# Patient Record
Sex: Male | Born: 1990 | Race: Black or African American | Hispanic: No | Marital: Single | Smoking: Current every day smoker
Health system: Southern US, Community
[De-identification: ages and names within clinical notes are randomized; demographics above are authoritative.]

---

## 2014-09-24 ENCOUNTER — Emergency Department (HOSPITAL_BASED_OUTPATIENT_CLINIC_OR_DEPARTMENT_OTHER)
Admission: EM | Admit: 2014-09-24 | Discharge: 2014-09-24 | Disposition: A | Discharge status: Home / Self Care | Payer: Self-pay | Attending: Emergency Medicine | Admitting: Emergency Medicine

## 2014-09-24 ENCOUNTER — Encounter (HOSPITAL_BASED_OUTPATIENT_CLINIC_OR_DEPARTMENT_OTHER): Payer: Self-pay

## 2014-09-24 DIAGNOSIS — K088 Other specified disorders of teeth and supporting structures: Secondary | ICD-10-CM | POA: Insufficient documentation

## 2014-09-24 DIAGNOSIS — K0263 Dental caries on smooth surface penetrating into pulp: Secondary | ICD-10-CM | POA: Insufficient documentation

## 2014-09-24 DIAGNOSIS — K029 Dental caries, unspecified: Secondary | ICD-10-CM

## 2014-09-24 DIAGNOSIS — K0889 Other specified disorders of teeth and supporting structures: Secondary | ICD-10-CM

## 2014-09-24 MED ORDER — ONDANSETRON 4 MG PO TBDP
4.0000 mg | ORAL_TABLET | Freq: Once | ORAL | Status: AC
  Administered 2014-09-24: 4 mg via ORAL
  Filled 2014-09-24: qty 1

## 2014-09-24 MED ORDER — PENICILLIN V POTASSIUM 500 MG PO TABS: 500.0000 mg | ORAL_TABLET | Freq: Four times a day (QID) | ORAL | Status: DC

## 2014-09-24 MED ORDER — OXYCODONE-ACETAMINOPHEN 5-325 MG PO TABS
2.0000 | ORAL_TABLET | Freq: Once | ORAL | Status: AC
  Administered 2014-09-24: 2 via ORAL
  Filled 2014-09-24: qty 2

## 2014-09-24 MED ORDER — OXYCODONE-ACETAMINOPHEN 5-325 MG PO TABS: 1.0000 | ORAL_TABLET | ORAL | Status: DC | PRN

## 2014-09-24 NOTE — ED Notes (Signed)
Left top toothache x 1 week

## 2014-09-24 NOTE — Discharge Instructions (Signed)
You have been diagnosed with Dental pain. Please call the follow up dentist first thing in the morning on Monday for a follow up appointment. Keep your discharge paperwork from today's visit to bring to the dentist office. You may also use the resource guide listed below to help you find a dentist if you do not already have one to followup with. It is very important that you get evaluated by a dentist as soon as possible.  Use your pain medication as prescribed and do not operate heavy machinery while on pain medication. Note that your pain medication contains acetaminophen (Tylenol) & its is not reccommended that you use additional acetaminophen (Tylenol) while taking this medication. Take your full course of antibiotics. Read the instructions below. ° °Eat a soft or liquid diet and rinse your mouth out after meals with warm water. You should see a dentist or return here at once if you have increased swelling, increased pain or uncontrolled bleeding from the site of your injury. ° ° °SEEK MEDICAL CARE IF:  °· You have increased pain not controlled with medicines.  °· You have swelling around your tooth, in your face or neck.  °· You have bleeding which starts, continues, or gets worse.  °· You have a fever >101 °· If you are unable to open your mouth °Soft Diet  °The soft diet may be recommended after you were put on a full liquid diet. A normal diet may follow. The soft diet can also be used after surgery if you are too ill to keep down a normal diet. The soft diet may also be needed if you have a hard time chewing foods.  °DESCRIPTION  °Tender foods are used. Foods do not need to be ground or pureed. Most raw fruits and vegetables and coarse breads and cereals should be avoided. Fried foods and highly seasoned foods may cause discomfort.  °NUTRITIONAL ADEQUACY  °A healthy diet is possible if foods from each of the basic food groups are eaten daily.  °SOFT DIET FOOD LISTS  °Milk/Dairy  °Allowed: Milk and milk  drinks, milk shakes, cream cheese, cottage cheese, mild cheeses.  °Avoid: Sharp or highly seasoned cheese. °Meat/Meat Substitutes  °Allowed: Broiled, roasted, baked, or stewed tender lean beef, mutton, lamb, veal, chicken, turkey, liver, ham, crisp bacon, white fish, tuna, salmon. Eggs, smooth peanut butter.  °Avoid: All fried meats, fish, or fowl. Rich gravies and sauces. Lunch meats, sausages, hot dogs. Meats with gristle, chunky peanut butter. °Breads/Grains  °Allowed: Rice, noodles, spaghetti, macaroni. Dry or cooked refined cereals, such as farina, cream of wheat, oatmeal, grits, whole-wheat cereals. Plain or toasted white or wheat blend or whole-grain breads, soda crackers or saltines, flour tortillas.  °Avoid: Wild rice, coarse cereals, such as bran. Seed in or on breads and crackers. Bread or bread products with nuts or seeds. °Fruits/Vegetables  °Allowed: Fruit and vegetable juices, well-cooked or canned fruits and vegetables, any dried fruit. One citrus fruit daily, 1 vitamin A source daily. Well-ripened, easy to chew fruits, sweet potatoes. Baked, boiled, mashed, creamed, scalloped, or au gratin potatoes. Broths or creamed soups made with allowed vegetables, strained tomatoes.  °Avoid: All gas-forming vegetables (corn, radishes, Brussels sprouts, onions, broccoli, cabbage, parsnips, turnips, chili peppers, pinto beans, split peas, dried beans). Fruits containing seeds and skin. Potato chips and corn chips. All others that are not made with allowed vegetables. Highly seasoned soups. °Desserts/Sweets  °Allowed: Simple desserts, such as custard, junkets, gelatin desserts, plain ice cream and sherbets, simple cakes   and cookies, allowed fruits, sugar, syrup, jelly, honey, plain hard candy, and molasses.  °Avoid: Rich pastries, any dessert containing dates, nuts, raisins, or coconut. Fried pastries, such as doughnuts. Chocolate. °Beverages  °Allowed: Fruit and vegetable juices. Caffeine-free carbonated drinks,  coffee, and tea.  °Avoid: Caffeinated beverages: coffee, tea, soda or pop. °Miscellaneous  °Allowed: Butter, cream, margarine, mayonnaise, oil. Cream sauces, salt, and mild spices.  °Avoid: Highly spiced salad dressings. Highly seasoned foods, hot sauce, mustard, horseradish, and pepper. °SAMPLE MENU  °Breakfast  °Orange juice.  °Oatmeal.  °Soft cooked egg.  °Toast and margarine.  °2% milk.  °Coffee. °Lunch  °Meatloaf.  °Mashed potato.  °Green beans.  °Lemon pudding.  °Bread and margarine.  °Coffee. °Dinner  °Consommé or apricot nectar.  °Chicken breast.  °Rice, peas, and carrots.  °Applesauce.  °Bread and margarine.  °2% milk. °To cut the amount of fat in your diet, omit margarine and use 1% or skim milk.  °NUTRIENT ANALYSIS  °Calories........................1953 Kcal.  °Protein.........................102 gm.  °Carbohydrate...............247 gm.  °Fat................................65 gm.  °Cholesterol...................449 mg.  °Dietary fiber.................19 gm.  °Vitamin A.....................2944 RE.  °Vitamin C.....................79 mg.  °Niacin..........................25 mg.  °Riboflavin....................2.0 mg.  °Thiamin.......................1.5 mg.  °Folate..........................249 mcg.  °Calcium.......................1030 mg.  °Phosphorus.................1782 mg.  °Zinc..............................12 mg.  °Iron..............................13 mg.  °Sodium.........................299 mg.  °Potassium....................3046 mg. °Document Released: 11/22/2007 Document Revised: 11/07/2011 Document Reviewed: 11/22/2007  °ExitCare® Patient Information ©2014 [employer].  ° °RESOURCE GUIDE ° ° °Dental Problems ° °Dr. Janna Civilis °$200 dollar visit °[address] °[place],  [address]  °[phone] °  ° °Patients with Medicaid: °[facility]                     [facility] °[address]                                           [address] °Phone:   [phone]                                                  Phone:  [phone] ° °If unable to pay or uninsured, contact:  [facility] or [facility] to become qualified for the adult dental clinic. ° °Chronic Pain Problems °Contact [facility]  [phone] °Patients need to be referred by their primary care doctor. ° °Insufficient Money for Medicine °Contact [employer]:  call "211" or [facility] 271-5999. ° °No Primary Care Doctor °Call [facility]  832-8000 °Other agencies that provide inexpensive medical care °   [facility]  [phone] °   [facility]  832-7272 °   [facility]  [phone] °   [facility]  832-4777 °   [facility]  373-0678 °   [facility]  [phone] ° °Psychological Services °[facility]  [phone] °[facility]  [phone] °[facility]   [phone] (emergency services 641-4993) ° °Substance Abuse Resources °[facility]  336-882-2125 °[facility] 336-784-9470 °[facility] 336-285-9073 °[facility] 336-845-3988 °Residential & Outpatient Substance Abuse Program  800-659-3381 ° °Abuse/Neglect °[place] [facility] Hotline (336) 641-3795 °[place] [facility] Abuse Hotline 800-378-5315 (After Hours) ° °Emergency Shelter °[place]   [employer] (518)010-3732  [facility] [place] [facility] (684)687-3481 [facility] 240-293-0544  MRSA Hotline #:   401-027-8057    [facility]  [facility]     [employer]                          [facility] [address] [place]                       [address]      [address]  [place] Reveal Phone:  [phone]                                   Phone:  (713)[phone]                 Phone:   (479)[phone]  [facility] Phone:  (787)649-4950  [facility] Child Abuse Hotline 225-367-2118 (905)133-2475 (After Hours)        Dental Caries Dental caries (also called tooth decay) is the most common oral disease. It can occur at any age but is more common in children and young adults.  HOW DENTAL CARIES DEVELOPS  The process of decay begins when bacteria and foods (particularly sugars and starches) combine in your mouth to produce plaque. Plaque is a substance that sticks to the hard, outer surface of a tooth (enamel). The bacteria in plaque produce acids that attack enamel. These acids may also attack the root surface of a tooth (cementum) if it is exposed. Repeated attacks dissolve these surfaces and create holes in the tooth (cavities). If left untreated, the acids destroy the other layers of the tooth.  RISK FACTORS  Frequent sipping of sugary beverages.   Frequent snacking on sugary and starchy foods, especially those that easily get stuck in the teeth.   Poor oral hygiene.   Dry mouth.   Substance abuse such as methamphetamine abuse.   Broken or poor-fitting dental restorations.   Eating disorders.   Gastroesophageal reflux disease (GERD).   Certain radiation treatments to the head and neck. SYMPTOMS In the early stages of dental caries, symptoms are seldom present. Sometimes white, chalky areas may be seen on the enamel or other tooth layers. In later stages, symptoms may include:  Pits and holes on the enamel.  Toothache after sweet, hot, or cold foods or drinks are consumed.  Pain around the tooth.  Swelling around the tooth. DIAGNOSIS  Most of the time, dental caries is detected during a regular dental checkup. A diagnosis is made after a thorough medical and dental history is taken and the surfaces of your teeth are checked for signs of dental caries. Sometimes special instruments, such as lasers, are used to check for dental  caries. Dental X-ray exams may be taken so that areas not visible to the eye (  such as between the contact areas of the teeth) can be checked for cavities.  TREATMENT  If dental caries is in its early stages, it may be reversed with a fluoride treatment or an application of a remineralizing agent at the dental office. Thorough brushing and flossing at home is needed to aid these treatments. If it is in its later stages, treatment depends on the location and extent of tooth destruction:   If a small area of the tooth has been destroyed, the destroyed area will be removed and cavities will be filled with a material such as gold, silver amalgam, or composite resin.   If a large area of the tooth has been destroyed, the destroyed area will be removed and a cap (crown) will be fitted over the remaining tooth structure.   If the center part of the tooth (pulp) is affected, a procedure called a root canal will be needed before a filling or crown can be placed.   If most of the tooth has been destroyed, the tooth may need to be pulled (extracted). HOME CARE INSTRUCTIONS You can prevent, stop, or reverse dental caries at home by practicing good oral hygiene. Good oral hygiene includes:  Thoroughly cleaning your teeth at least twice a day with a toothbrush and dental floss.   Using a fluoride toothpaste. A fluoride mouth rinse may also be used if recommended by your dentist or health care provider.   Restricting the amount of sugary and starchy foods and sugary liquids you consume.   Avoiding frequent snacking on these foods and sipping of these liquids.   Keeping regular visits with a dentist for checkups and cleanings. PREVENTION   Practice good oral hygiene.  Consider a dental sealant. A dental sealant is a coating material that is applied by your dentist to the pits and grooves of teeth. The sealant prevents food from being trapped in them. It may protect the teeth for several  years.  Ask about fluoride supplements if you live in a community without fluorinated water or with water that has a low fluoride content. Use fluoride supplements as directed by your dentist or health care provider.  Allow fluoride varnish applications to teeth if directed by your dentist or health care provider. Document Released: 05/07/2002 Document Revised: 12/30/2013 Document Reviewed: 08/17/2012 [facility] Patient Information 2015 [place], [place]. This information is not intended to replace advice given to you by your health care provider. Make sure you discuss any questions you have with your health care provider.  Dental Pain A tooth ache may be caused by cavities (tooth decay). Cavities expose the nerve of the tooth to air and hot or cold temperatures. It may come from an infection or abscess (also called a boil or furuncle) around your tooth. It is also often caused by dental caries (tooth decay). This causes the pain you are having. DIAGNOSIS  Your caregiver can diagnose this problem by exam. TREATMENT   If caused by an infection, it may be treated with medications which kill germs (antibiotics) and pain medications as prescribed by your caregiver. Take medications as directed.  Only take over-the-counter or prescription medicines for pain, discomfort, or fever as directed by your caregiver.  Whether the tooth ache today is caused by infection or dental disease, you should see your dentist as soon as possible for further care. SEEK MEDICAL CARE IF: The exam and treatment you received today has been provided on an emergency basis only. This is not a substitute for complete medical  or dental care. If your problem worsens or new problems (symptoms) appear, and you are unable to meet with your dentist, call or return to this location. SEEK IMMEDIATE MEDICAL CARE IF:   You have a fever.  You develop redness and swelling of your face, jaw, or neck.  You are unable to open your  mouth.  You have severe pain uncontrolled by pain medicine. MAKE SURE YOU:   Understand these instructions.  Will watch your condition.  Will get help right away if you are not doing well or get worse. Document Released: 08/15/2005 Document Revised: 11/07/2011 Document Reviewed: 04/02/2008 [facility] Patient Information 2015 [place], [place]. This information is not intended to replace advice given to you by your health care provider. Make sure you discuss any questions you have with your health care provider.

## 2014-09-24 NOTE — ED Provider Notes (Signed)
CSN: [id number]     Arrival date & time 09/24/14  1736 History   First MD Initiated Contact with Patient 09/24/14 1853     Chief Complaint  Patient presents with  . Dental Pain     (Consider location/radiation/quality/duration/timing/severity/associated sxs/prior Treatment) Patient is a 24 y.o. male presenting with tooth pain. The history is provided by the patient. No language interpreter was used.  Dental Pain Location:  Upper Upper teeth location:  13/LU 2nd bicuspid Quality:  Sharp and throbbing Severity:  Severe Onset quality:  Gradual Duration:  1 week Timing:  Constant Progression:  Worsening Chronicity:  New Context: dental caries   Relieved by:  Nothing Worsened by:  Cold food/drink, pressure and touching Ineffective treatments:  Acetaminophen, topical anesthetic gel and NSAIDs Associated symptoms: no congestion, no difficulty swallowing, no drooling, no facial pain, no facial swelling, no fever, no gum swelling, no headaches, no neck pain, no neck swelling, no oral bleeding, no oral lesions and no trismus   Risk factors: no smoking     History reviewed. No pertinent past medical history. History reviewed. No pertinent past surgical history. No family history on file. History  Substance Use Topics  . Smoking status: Never Smoker   . Smokeless tobacco: Not on file  . Alcohol Use: No    Review of Systems  Constitutional: Negative for fever.  HENT: Negative for congestion, drooling, facial swelling and mouth sores.   Musculoskeletal: Negative for neck pain.  Neurological: Negative for headaches.  All other systems reviewed and are negative.     Allergies  Review of patient's allergies indicates no known allergies.  Home Medications   Prior to Admission medications   Not on File   BP 149/91 mmHg  Pulse 66  Temp(Src) 98.4 F (36.9 C) (Oral)  Resp 16  Ht 5\' 9"  (1.753 m)  Wt 295 lb (133.811 kg)  BMI 43.54 kg/m2  SpO2 100% Physical Exam    Constitutional: He appears well-developed and well-nourished. No distress.  HENT:  Head: Normocephalic and atraumatic.  Mouth/Throat:    Eyes: Conjunctivae are normal. No scleral icterus.  Neck: Normal range of motion. Neck supple.  Cardiovascular: Normal rate, regular rhythm and normal heart sounds.   Pulmonary/Chest: Effort normal and breath sounds normal. No respiratory distress.  Abdominal: Soft. There is no tenderness.  Musculoskeletal: He exhibits no edema.  Neurological: He is alert.  Skin: Skin is warm and dry. He is not diaphoretic.  Psychiatric: His behavior is normal.  Nursing note and vitals reviewed.   ED Course  Procedures (including critical care time) Labs Review Labs Reviewed - No data to display  Imaging Review No results found.   EKG Interpretation None      MDM   Final diagnoses:  None    Patient with toothache.  No gross abscess.  Exam unconcerning for Ludwig's angina or spread of infection.  Will treat with penicillin and pain medicine.  Urged patient to follow-up with dentist.      Arthor CaptainAbigail Arturo Sofranko, PA-C 09/24/14 1910  Vanetta MuldersScott Zackowski, MD 09/27/14 2152

## 2016-07-04 ENCOUNTER — Emergency Department (HOSPITAL_COMMUNITY): Payer: Self-pay

## 2016-07-04 ENCOUNTER — Encounter (HOSPITAL_COMMUNITY): Payer: Self-pay | Admitting: Radiology

## 2016-07-04 ENCOUNTER — Emergency Department (HOSPITAL_COMMUNITY)
Admission: EM | Admit: 2016-07-04 | Discharge: 2016-07-04 | Disposition: A | Discharge status: Home / Self Care | Payer: Self-pay | Attending: Emergency Medicine | Admitting: Emergency Medicine

## 2016-07-04 DIAGNOSIS — W3400XA Accidental discharge from unspecified firearms or gun, initial encounter: Secondary | ICD-10-CM | POA: Insufficient documentation

## 2016-07-04 DIAGNOSIS — Y9384 Activity, sleeping: Secondary | ICD-10-CM | POA: Insufficient documentation

## 2016-07-04 DIAGNOSIS — Y92009 Unspecified place in unspecified non-institutional (private) residence as the place of occurrence of the external cause: Secondary | ICD-10-CM | POA: Insufficient documentation

## 2016-07-04 DIAGNOSIS — S31139A Puncture wound of abdominal wall without foreign body, unspecified quadrant without penetration into peritoneal cavity, initial encounter: Secondary | ICD-10-CM

## 2016-07-04 DIAGNOSIS — S31109A Unspecified open wound of abdominal wall, unspecified quadrant without penetration into peritoneal cavity, initial encounter: Secondary | ICD-10-CM | POA: Insufficient documentation

## 2016-07-04 DIAGNOSIS — Y999 Unspecified external cause status: Secondary | ICD-10-CM | POA: Insufficient documentation

## 2016-07-04 DIAGNOSIS — F17200 Nicotine dependence, unspecified, uncomplicated: Secondary | ICD-10-CM | POA: Insufficient documentation

## 2016-07-04 DIAGNOSIS — R109 Unspecified abdominal pain: Secondary | ICD-10-CM | POA: Insufficient documentation

## 2016-07-04 DIAGNOSIS — Z79899 Other long term (current) drug therapy: Secondary | ICD-10-CM | POA: Insufficient documentation

## 2016-07-04 LAB — COMPREHENSIVE METABOLIC PANEL
ALBUMIN: 3.9 g/dL (ref 3.5–5.0)
ALT: 18 U/L (ref 17–63)
AST: 24 U/L (ref 15–41)
Alkaline Phosphatase: 65 U/L (ref 38–126)
Anion gap: 8 (ref 5–15)
BILIRUBIN TOTAL: 0.5 mg/dL (ref 0.3–1.2)
BUN: 7 mg/dL (ref 6–20)
CO2: 28 mmol/L (ref 22–32)
CREATININE: 1.26 mg/dL — AB (ref 0.61–1.24)
Calcium: 9.5 mg/dL (ref 8.9–10.3)
Chloride: 106 mmol/L (ref 101–111)
GFR calc Af Amer: >60 mL/min (ref >60)
GLUCOSE: 97 mg/dL (ref 65–99)
POTASSIUM: 3.7 mmol/L (ref 3.5–5.1)
Sodium: 142 mmol/L (ref 135–145)
TOTAL PROTEIN: 6.7 g/dL (ref 6.5–8.1)

## 2016-07-04 LAB — URINALYSIS, ROUTINE W REFLEX MICROSCOPIC
BILIRUBIN URINE: NEGATIVE
Glucose, UA: NEGATIVE mg/dL
HGB URINE DIPSTICK: NEGATIVE
KETONES UR: NEGATIVE mg/dL
Leukocytes, UA: NEGATIVE
NITRITE: NEGATIVE
PH: 6 (ref 5.0–8.0)
Protein, ur: 30 mg/dL — AB
SPECIFIC GRAVITY, URINE: 1.02 (ref 1.005–1.030)

## 2016-07-04 LAB — URINE MICROSCOPIC-ADD ON

## 2016-07-04 LAB — I-STAT CHEM 8, ED
BUN: 9 mg/dL (ref 6–20)
CHLORIDE: 100 mmol/L — AB (ref 101–111)
CREATININE: 1.2 mg/dL (ref 0.61–1.24)
Calcium, Ion: 1.13 mmol/L — ABNORMAL LOW (ref 1.15–1.40)
Glucose, Bld: 92 mg/dL (ref 65–99)
HEMATOCRIT: 46 % (ref 39.0–52.0)
Hemoglobin: 15.6 g/dL (ref 13.0–17.0)
POTASSIUM: 3.9 mmol/L (ref 3.5–5.1)
SODIUM: 142 mmol/L (ref 135–145)
TCO2: 31 mmol/L (ref 0–100)

## 2016-07-04 LAB — TYPE AND SCREEN
ABO/RH(D): O POS
Antibody Screen: NEGATIVE
UNIT DIVISION: 0
UNIT DIVISION: 0

## 2016-07-04 LAB — ABO/RH: ABO/RH(D): O POS

## 2016-07-04 LAB — PROTIME-INR
INR: 1.06
Prothrombin Time: 13.8 seconds (ref 11.4–15.2)

## 2016-07-04 LAB — PREPARE FRESH FROZEN PLASMA
Unit division: 0
Unit division: 0

## 2016-07-04 LAB — CBC
HEMATOCRIT: 44.1 % (ref 39.0–52.0)
Hemoglobin: 15.4 g/dL (ref 13.0–17.0)
MCH: 30.7 pg (ref 26.0–34.0)
MCHC: 34.9 g/dL (ref 30.0–36.0)
MCV: 88 fL (ref 78.0–100.0)
PLATELETS: 207 10*3/uL (ref 150–400)
RBC: 5.01 MIL/uL (ref 4.22–5.81)
RDW: 13.3 % (ref 11.5–15.5)
WBC: 7.1 10*3/uL (ref 4.0–10.5)

## 2016-07-04 LAB — CDS SEROLOGY

## 2016-07-04 LAB — I-STAT CG4 LACTIC ACID, ED: Lactic Acid, Venous: 1.38 mmol/L (ref 0.5–1.9)

## 2016-07-04 LAB — ETHANOL: Alcohol, Ethyl (B): <5 mg/dL (ref <5)

## 2016-07-04 MED ORDER — HYDROCODONE-ACETAMINOPHEN 5-325 MG PO TABS: 1.0000 | ORAL_TABLET | Freq: Four times a day (QID) | ORAL | 0 refills | Status: DC | PRN

## 2016-07-04 MED ORDER — HYDROCODONE-ACETAMINOPHEN 5-325 MG PO TABS
2.0000 | ORAL_TABLET | Freq: Once | ORAL | Status: AC
  Administered 2016-07-04: 2 via ORAL
  Filled 2016-07-04: qty 2

## 2016-07-04 MED ORDER — SODIUM CHLORIDE 0.9 % IV BOLUS (SEPSIS)
125.0000 mL | Freq: Once | INTRAVENOUS | Status: AC
  Administered 2016-07-04: 1000 mL via INTRAVENOUS

## 2016-07-04 MED ORDER — BACITRACIN ZINC 500 UNIT/GM EX OINT
TOPICAL_OINTMENT | Freq: Once | CUTANEOUS | Status: AC
  Administered 2016-07-04: 1 via TOPICAL
  Filled 2016-07-04: qty 0.9

## 2016-07-04 MED ORDER — IOPAMIDOL (ISOVUE-300) INJECTION 61%
75.0000 mL | Freq: Once | INTRAVENOUS | Status: AC | PRN
Start: 2016-07-04 — End: 2016-07-04
  Administered 2016-07-04: 75 mL via INTRAVENOUS
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
## 2016-07-04 NOTE — ED Notes (Signed)
Patient given water

## 2016-07-04 NOTE — ED Notes (Signed)
To CT with RN.
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
## 2016-07-04 NOTE — ED Notes (Signed)
Patient returned from CT with RN.

## 2016-07-05 ENCOUNTER — Encounter (HOSPITAL_BASED_OUTPATIENT_CLINIC_OR_DEPARTMENT_OTHER): Payer: Self-pay

## 2016-09-25 ENCOUNTER — Encounter (HOSPITAL_BASED_OUTPATIENT_CLINIC_OR_DEPARTMENT_OTHER): Payer: Self-pay | Admitting: Emergency Medicine

## 2016-09-25 DIAGNOSIS — F17200 Nicotine dependence, unspecified, uncomplicated: Secondary | ICD-10-CM | POA: Insufficient documentation

## 2016-09-25 DIAGNOSIS — K029 Dental caries, unspecified: Secondary | ICD-10-CM | POA: Diagnosis not present

## 2016-09-25 DIAGNOSIS — K0889 Other specified disorders of teeth and supporting structures: Secondary | ICD-10-CM | POA: Diagnosis present
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
## 2016-09-26 ENCOUNTER — Emergency Department (HOSPITAL_BASED_OUTPATIENT_CLINIC_OR_DEPARTMENT_OTHER)
Admission: EM | Admit: 2016-09-26 | Discharge: 2016-09-26 | Disposition: A | Discharge status: Home / Self Care | Payer: BLUE CROSS/BLUE SHIELD | Attending: Emergency Medicine | Admitting: Emergency Medicine

## 2016-09-26 DIAGNOSIS — K029 Dental caries, unspecified: Secondary | ICD-10-CM

## 2016-09-26 MED ORDER — IBUPROFEN 800 MG PO TABS
800.0000 mg | ORAL_TABLET | Freq: Once | ORAL | Status: AC
  Administered 2016-09-26: 800 mg via ORAL
  Filled 2016-09-26: qty 1

## 2016-09-26 MED ORDER — PENICILLIN V POTASSIUM 250 MG PO TABS
500.0000 mg | ORAL_TABLET | Freq: Once | ORAL | Status: AC
  Administered 2016-09-26: 500 mg via ORAL
  Filled 2016-09-26: qty 2

## 2016-09-26 MED ORDER — PENICILLIN V POTASSIUM 500 MG PO TABS: 500.0000 mg | ORAL_TABLET | Freq: Four times a day (QID) | ORAL | 0 refills | Status: AC
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
## 2016-09-26 NOTE — Discharge Instructions (Signed)
You may alternate between ibuprofen 800 mg every 8 hours as needed for fever and pain and Tylenol 1000 mg every 6 hours as needed for fever and pain. Please follow-up with your dentist.
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
